# Patient Record
Sex: Female | Born: 1980 | Race: Black or African American | Hispanic: No | Marital: Single | Smoking: Current every day smoker
Health system: Southern US, Community
[De-identification: ages and names within clinical notes are randomized; demographics above are authoritative.]

## PROBLEM LIST (undated history)

## (undated) DIAGNOSIS — F419 Anxiety disorder, unspecified: Secondary | ICD-10-CM

---

## 2011-04-03 ENCOUNTER — Emergency Department: Payer: Self-pay | Admitting: Emergency Medicine

## 2011-06-22 ENCOUNTER — Emergency Department: Payer: Self-pay | Admitting: Emergency Medicine

## 2011-11-26 ENCOUNTER — Emergency Department: Payer: Self-pay | Admitting: Emergency Medicine

## 2012-02-19 ENCOUNTER — Emergency Department: Payer: Self-pay | Admitting: *Deleted

## 2015-06-10 ENCOUNTER — Emergency Department
Admission: EM | Admit: 2015-06-10 | Discharge: 2015-06-10 | Disposition: A | Discharge status: Home / Self Care | Payer: Medicaid - Out of State | Attending: Emergency Medicine | Admitting: Emergency Medicine

## 2015-06-10 ENCOUNTER — Encounter: Payer: Self-pay | Admitting: Emergency Medicine

## 2015-06-10 DIAGNOSIS — F17200 Nicotine dependence, unspecified, uncomplicated: Secondary | ICD-10-CM | POA: Insufficient documentation

## 2015-06-10 DIAGNOSIS — K529 Noninfective gastroenteritis and colitis, unspecified: Secondary | ICD-10-CM

## 2015-06-10 MED ORDER — SODIUM CHLORIDE 0.9 % IV SOLN
Freq: Once | INTRAVENOUS | Status: AC
  Administered 2015-06-10: 12:00:00 via INTRAVENOUS
  Filled 2015-06-10: qty 1000

## 2015-06-10 MED ORDER — ONDANSETRON HCL 4 MG PO TABS: 4.0000 mg | ORAL_TABLET | Freq: Every day | ORAL | Status: AC | PRN

## 2015-06-10 MED ORDER — LOPERAMIDE HCL 2 MG PO CAPS
4.0000 mg | ORAL_CAPSULE | Freq: Once | ORAL | Status: AC
  Administered 2015-06-10: 4 mg via ORAL
  Filled 2015-06-10: qty 2

## 2015-06-10 MED ORDER — ONDANSETRON HCL 4 MG/2ML IJ SOLN
4.0000 mg | Freq: Once | INTRAMUSCULAR | Status: AC
  Administered 2015-06-10: 4 mg via INTRAVENOUS
  Filled 2015-06-10: qty 2

## 2015-06-10 MED ORDER — LOPERAMIDE HCL 2 MG PO TABS: 2.0000 mg | ORAL_TABLET | Freq: Four times a day (QID) | ORAL | Status: AC | PRN

## 2015-06-10 MED ORDER — MORPHINE SULFATE (PF) 4 MG/ML IV SOLN
4.0000 mg | Freq: Once | INTRAVENOUS | Status: AC
  Administered 2015-06-10: 4 mg via INTRAVENOUS
  Filled 2015-06-10: qty 1

## 2015-06-10 NOTE — Discharge Instructions (Signed)
Norovirus Infection °A norovirus infection is caused by exposure to a virus in a group of similar viruses (noroviruses). This type of infection causes inflammation in your stomach and intestines (gastroenteritis). Norovirus is the most common cause of gastroenteritis. It also causes food poisoning. °Anyone can get a norovirus infection. It spreads very easily (contagious). You can get it from contaminated food, water, surfaces, or other people. Norovirus is found in the stool or vomit of infected people. You can spread the infection as soon as you feel sick until 2 weeks after you recover.  °Symptoms usually begin within 2 days after you become infected. Most norovirus symptoms affect the digestive system. °CAUSES °Norovirus infection is caused by contact with norovirus. You can catch norovirus if you: °· Eat or drink something contaminated with norovirus. °· Touch surfaces or objects contaminated with norovirus and then put your hand in your mouth. °· Have direct contact with an infected person who has symptoms. °· Share food, drink, or utensils with someone with who is sick with norovirus. °SIGNS AND SYMPTOMS °Symptoms of norovirus may include: °· Nausea. °· Vomiting. °· Diarrhea. °· Stomach cramps. °· Fever. °· Chills. °· Headache. °· Muscle aches. °· Tiredness. °DIAGNOSIS °Your health care provider may suspect norovirus based on your symptoms and physical exam. Your health care provider may also test a sample of your stool or vomit for the virus.  °TREATMENT °There is no specific treatment for norovirus. Most people get better without treatment in about 2 days. °HOME CARE INSTRUCTIONS °· Replace lost fluids by drinking plenty of water or rehydration fluids containing important minerals called electrolytes. This prevents dehydration. Drink enough fluid to keep your urine clear or pale yellow. °· Do not prepare food for others while you are infected. Wait at least 3 days after recovering from the illness to do  that. °PREVENTION  °· Wash your hands often, especially after using the toilet or changing a diaper. °· Wash fruits and vegetables thoroughly before preparing or serving them. °· Throw out any food that a sick person may have touched. °· Disinfect contaminated surfaces immediately after someone in the household has been sick. Use a bleach-based household cleaner. °· Immediately remove and wash soiled clothes or sheets. °SEEK MEDICAL CARE IF: °· Your vomiting, diarrhea, and stomach pain is getting worse. °· Your symptoms of norovirus do not go away after 2-3 days. °SEEK IMMEDIATE MEDICAL CARE IF:  °You develop symptoms of dehydration that do not improve with fluid replacement. This may include: °· Excessive sleepiness. °· Lack of tears. °· Dry mouth. °· Dizziness when standing. °· Weak pulse. °  °This information is not intended to replace advice given to you by your health care provider. Make sure you discuss any questions you have with your health care provider. °  °Document Released: 09/02/2002 Document Revised: 07/03/2014 Document Reviewed: 11/20/2013 °Elsevier Interactive Patient Education ©2016 [employer] ° °

## 2015-06-10 NOTE — ED Provider Notes (Signed)
[facility] Emergency Department Provider Note     Time seen: ----------------------------------------- 11:27 AM on 06/10/2015 -----------------------------------------    I have reviewed the triage vital signs and the nursing notes.   HISTORY  Chief Complaint Emesis and Diarrhea    HPI Molly Kim is a 34 y.o. female who presents ER with vomiting and diarrhea since Tuesday. She denies any fevers chills or other complaints. Patient states she hasn't been able to keep anything down at least the last 24 hours. She currently works at the [employer]   History reviewed. No pertinent past medical history.  There are no active problems to display for this patient.   History reviewed. No pertinent past surgical history.  Allergies Review of patient's allergies indicates no known allergies.  Social History Social History  Substance Use Topics  . Smoking status: Current Every Day Smoker  . Smokeless tobacco: None  . Alcohol Use: No    Review of Systems Constitutional: Negative for fever. Eyes: Negative for visual changes. ENT: Negative for sore throat. Cardiovascular: Negative for chest pain. Respiratory: Negative for shortness of breath. Gastrointestinal: Positive for abdominal pain, vomiting and diarrhea Genitourinary: Negative for dysuria. Musculoskeletal: Negative for back pain. Skin: Negative for rash. Neurological: Negative for headaches, focal weakness or numbness.  10-point ROS otherwise negative.  ____________________________________________   PHYSICAL EXAM:  VITAL SIGNS: ED Triage Vitals  Enc Vitals Group     BP 06/10/15 1101 92/73 mmHg     Pulse Rate 06/10/15 1101 96     Resp 06/10/15 1101 18     Temp 06/10/15 1101 97.5 F (36.4 C)     Temp Source 06/10/15 1101 Oral     SpO2 06/10/15 1101 95 %     Weight 06/10/15 1101 260 lb (117.935 kg)     Height 06/10/15 1101  (1.702 m)     Head Cir --      Peak Flow --     Pain Score 06/10/15 1106 6     Pain Loc --      Pain Edu? --      Excl. in GC? --     Constitutional: Alert and oriented. Well appearing and in no distress. Eyes: Conjunctivae are normal. PERRL. Normal extraocular movements. ENT   Head: Normocephalic and atraumatic.   Nose: No congestion/rhinnorhea.   Mouth/Throat: Mucous membranes are moist.   Neck: No stridor. Cardiovascular: Normal rate, regular rhythm. Normal and symmetric distal pulses are present in all extremities. No murmurs, rubs, or gallops. Respiratory: Normal respiratory effort without tachypnea nor retractions. Breath sounds are clear and equal bilaterally. No wheezes/rales/rhonchi. Gastrointestinal: Soft and nontender. No distention. No abdominal bruits.  Musculoskeletal: Nontender with normal range of motion in all extremities. No joint effusions.  No lower extremity tenderness nor edema. Neurologic:  Normal speech and language. No gross focal neurologic deficits are appreciated. Speech is normal. No gait instability. Skin:  Skin is warm, dry and intact. No rash noted. Psychiatric: Mood and affect are normal. Speech and behavior are normal. Patient exhibits appropriate insight and judgment. ____________________________________________  ED COURSE:  Pertinent labs & imaging results that were available during my care of the patient were reviewed by me and considered in my medical decision making (see chart for details). Patients in no acute distress, likely nor a virus infection. She'll receive IV fluids and antiemetics. ___________________________________________  FINAL ASSESSMENT AND PLAN  Gastroenteritis  Plan: Patient with labs and imaging as dictated above. Patient is feeling better after IV  fluids and medications. She'll be prescribed antiemetics take as an outpatient.   Emily FilbertWilliams, Jonathan E, MD   Emily FilbertJonathan E Williams, MD 06/10/15 [phone]

## 2015-06-10 NOTE — ED Notes (Signed)
Pt presents with vomiting and diarrhea since Tuesday. Denies fever.

## 2016-01-17 ENCOUNTER — Emergency Department
Admission: EM | Admit: 2016-01-17 | Discharge: 2016-01-17 | Disposition: A | Discharge status: Home / Self Care | Payer: Medicaid Other | Attending: Emergency Medicine | Admitting: Emergency Medicine

## 2016-01-17 ENCOUNTER — Encounter: Payer: Self-pay | Admitting: Emergency Medicine

## 2016-01-17 ENCOUNTER — Emergency Department: Payer: Medicaid Other

## 2016-01-17 DIAGNOSIS — F319 Bipolar disorder, unspecified: Secondary | ICD-10-CM | POA: Diagnosis not present

## 2016-01-17 DIAGNOSIS — F17200 Nicotine dependence, unspecified, uncomplicated: Secondary | ICD-10-CM | POA: Diagnosis not present

## 2016-01-17 DIAGNOSIS — R0789 Other chest pain: Secondary | ICD-10-CM

## 2016-01-17 DIAGNOSIS — F419 Anxiety disorder, unspecified: Secondary | ICD-10-CM | POA: Insufficient documentation

## 2016-01-17 HISTORY — DX: Anxiety disorder, unspecified: F41.9

## 2016-01-17 LAB — TROPONIN I

## 2016-01-17 LAB — COMPREHENSIVE METABOLIC PANEL
ALK PHOS: 68 U/L (ref 38–126)
ALT: 12 U/L — AB (ref 14–54)
AST: 16 U/L (ref 15–41)
Albumin: 3.7 g/dL (ref 3.5–5.0)
Anion gap: 5 (ref 5–15)
BILIRUBIN TOTAL: 0.9 mg/dL (ref 0.3–1.2)
BUN: 10 mg/dL (ref 6–20)
CALCIUM: 8.3 mg/dL — AB (ref 8.9–10.3)
CO2: 27 mmol/L (ref 22–32)
CREATININE: 0.89 mg/dL (ref 0.44–1.00)
Chloride: 109 mmol/L (ref 101–111)
Glucose, Bld: 88 mg/dL (ref 65–99)
Potassium: 3.4 mmol/L — ABNORMAL LOW (ref 3.5–5.1)
Sodium: 141 mmol/L (ref 135–145)
TOTAL PROTEIN: 7.4 g/dL (ref 6.5–8.1)

## 2016-01-17 LAB — CBC
HEMATOCRIT: 36.7 % (ref 35.0–47.0)
HEMOGLOBIN: 12.2 g/dL (ref 12.0–16.0)
MCH: 27 pg (ref 26.0–34.0)
MCHC: 33.3 g/dL (ref 32.0–36.0)
MCV: 80.9 fL (ref 80.0–100.0)
PLATELETS: 320 10*3/uL (ref 150–440)
RBC: 4.54 MIL/uL (ref 3.80–5.20)
RDW: 14.5 % (ref 11.5–14.5)
WBC: 9.1 10*3/uL (ref 3.6–11.0)

## 2016-01-17 MED ORDER — POTASSIUM CHLORIDE CRYS ER 20 MEQ PO TBCR
40.0000 meq | EXTENDED_RELEASE_TABLET | Freq: Once | ORAL | Status: AC
  Administered 2016-01-17: 40 meq via ORAL
  Filled 2016-01-17: qty 2

## 2016-01-17 MED ORDER — IBUPROFEN 800 MG PO TABS
800.0000 mg | ORAL_TABLET | Freq: Once | ORAL | Status: AC
  Administered 2016-01-17: 800 mg via ORAL
  Filled 2016-01-17: qty 1

## 2016-01-17 NOTE — Discharge Instructions (Signed)
Please make an appointment with your primary care physician to have your potassium recheck. Please also talk to your primary care doctor about your anxiety and your chest pain. Appointment with your therapist on Thursday.  Return to the emergency department if you develop severe chest pain, shortness of breath, lightheadedness, palpitations or fainting, or any other symptoms concerning to you.

## 2016-01-17 NOTE — ED Notes (Signed)
Pt. Presenting to ED with symptoms of generalized body aches reporting her limbs feel heavy, and she has a heaviness in her chest and these symptoms are interrupting her ADLs. Pt. States that she has been sleeping more and that is not normal for her.   Pt. States she has a hx of anxiety and she believes this may be the root of her presenting physical symptoms. Pt. Reports many life changes and stressors recently and that her recent move from another state has left her without her anxiety medications for 7 months due to no MD here.

## 2016-01-17 NOTE — ED Notes (Signed)
Pt. Verbalizes understanding of d/c instructions, prescriptions, and follow-up. VS stable.  Pt. In NAD at time of d/c and denies concerns. Pt. Ambulatory Out of the unit with steady gait more calm than initially presenting to tx. Room.

## 2016-01-17 NOTE — ED Provider Notes (Signed)
[facility] Emergency Department Provider Note  ____________________________________________  Time seen: Approximately 8:53 PM  I have reviewed the triage vital signs and the nursing notes.   HISTORY  Chief Complaint Generalized Body Aches; Anxiety; and Chest Pain    HPI Molly Kim is a 35 y.o. female with a history of anxiety and bipolar disorder but otherwise negative for any other cardiac risk factors presenting with chest pain and panic attacks. The patient reports that for the past week she has had occasionally intermittent and occasionally constant chest pressure across the entirety of her chest pain she has no associated shortness of breath, palpitations, lightheadedness or syncope. She has had no lower extremity swelling or calf pain. This feels similar to her previous anxiety, but she has had increased stress at home and "wanted to get checked out to make sure I was okay." The patient has a weakly appointment with her therapist on Thursdays, and this has been helping her anxiety and stress.  FH: No history of early heart disease SH: Denies tobacco, cocaine.  Past Medical History:  Diagnosis Date  . Anxiety     There are no active problems to display for this patient.   History reviewed. No pertinent surgical history.  Current Outpatient Rx  . Order #: [id number] Class: Print  . Order #: [id number] Class: Print    Allergies Depakote [valproic acid]  History reviewed. No pertinent family history.  Social History Social History  Substance Use Topics  . Smoking status: Current Every Day Smoker  . Smokeless tobacco: Never Used  . Alcohol use No    Review of Systems Constitutional: No fever/chills.No lightheadedness or syncope Eyes: No visual changes. ENT: No sore throat. No congestion or rhinorrhea. Cardiovascular: Positive chest pain. Denies palpitations. Respiratory: Denies shortness of breath.  No cough. Gastrointestinal: No  abdominal pain.  No nausea, no vomiting.  No diarrhea.  No constipation. Genitourinary: Negative for dysuria. Musculoskeletal: Negative for back pain. Skin: Negative for rash. Neurological: Negative for headaches. No focal numbness, tingling or weakness.  Psychiatric:Positive for anxiety. No SI, HI or hallucinations.  10-point ROS otherwise negative.  ____________________________________________   PHYSICAL EXAM:  VITAL SIGNS: ED Triage Vitals  Enc Vitals Group     BP 01/17/16 1936 (!) 125/94     Pulse Rate 01/17/16 1936 78     Resp 01/17/16 1936 18     Temp 01/17/16 1936 98.2 F (36.8 C)     Temp Source 01/17/16 1936 Oral     SpO2 01/17/16 1936 98 %     Weight 01/17/16 1936 262 lb (118.8 kg)     Height 01/17/16 1936  (1.702 m)     Head Circumference --      Peak Flow --      Pain Score 01/17/16 1938 8     Pain Loc --      Pain Edu? --      Excl. in GC? --     Constitutional: Alert and oriented. Well appearing and in no acute distress. Answers questions appropriately. Eyes: Conjunctivae are normal.  EOMI. No scleral icterus. Head: Atraumatic. Nose: No congestion/rhinnorhea. Mouth/Throat: Mucous membranes are moist.  Neck: No stridor.  Supple.  No JVD Cardiovascular: Normal rate, regular rhythm. No murmurs, rubs or gallops.  Respiratory: Normal respiratory effort.  No accessory muscle use or retractions. Lungs CTAB.  No wheezes, rales or ronchi. Gastrointestinal: Soft, nontender and nondistended.  No guarding or rebound.  No peritoneal signs. Musculoskeletal: No LE edema. No ttp  in the calves or palpable cords.  Negative Homan's sign. Neurologic:  A&Ox3.  Speech is clear.  Face and smile are symmetric.  EOMI.  Moves all extremities well. Skin:  Skin is warm, dry and intact. No rash noted. Psychiatric: Mood and affect are normal. Speech and behavior are normal.  Normal judgement. No evidence of significant anxiety at this time. The patient again denies any SI, HI or  hallucinations  ____________________________________________   LABS (all labs ordered are listed, but only abnormal results are displayed)  Labs Reviewed  COMPREHENSIVE METABOLIC PANEL - Abnormal; Notable for the following:       Result Value   Potassium 3.4 (*)    Calcium 8.3 (*)    ALT 12 (*)    All other components within normal limits  CBC  TROPONIN I   ____________________________________________  EKG  ED ECG REPORT I, Rockne Menghini, the attending physician, personally viewed and interpreted this ECG.   Date: 01/17/2016  EKG Time: 2011  Rate: 72  Rhythm: normal sinus rhythm  Axis: normal  Intervals:first-degree A-V block   ST&T Change: No ST elevation. No evidence of prolonged QTC. No evidence of Brugada. No evidence of hypertrophy.  ____________________________________________  RADIOLOGY  Dg Chest 2 View  Result Date: 01/17/2016 CLINICAL DATA:  Chest pain for 2 weeks. EXAM: CHEST  2 VIEW COMPARISON:  None. FINDINGS: The cardiac silhouette, mediastinal and hilar contours are within normal limits. The lungs are clear. No pleural effusion. The bony thorax is intact. IMPRESSION: No acute cardiopulmonary findings. Electronically Signed   By: Rudie Meyer M.D.   On: 01/17/2016 20:05   ____________________________________________   PROCEDURES  Procedure(s) performed: None  Procedures  Critical Care performed: No ____________________________________________   INITIAL IMPRESSION / ASSESSMENT AND PLAN / ED COURSE  Pertinent labs & imaging results that were available during my care of the patient were reviewed by me and considered in my medical decision making (see chart for details).  35 y.o. female with a history of anxiety, without any history of hypertension, hyperlipidemia, diabetes, early CAD family history, or cocaine use or tobacco abuse, presenting with 1 week of chest pressure. Overall, the patient is well-appearing and has a normal  cardiopulmonary examination. She has no evidence of DVT. Her EKG does not show any ischemic changes or arrhythmias, her troponin is negative and her chest x-ray does not show any acute cardiopulmonary disease. At this time, the patient is asymptomatic. It is likely that her symptoms are due to increasing stress that is triggering her anxiety, and a cardiac or pulmonary cause for her symptoms is much less likely. ACS or MI or PE are very unlikely. I will plan to discharge the patient home, supplement her potassium, and she will follow up with her primary care physician. In addition she will keep her therapy appointment on Thursday. Return precautions as well as follow-up instructions were discussed.  ____________________________________________  FINAL CLINICAL IMPRESSION(S) / ED DIAGNOSES  Final diagnoses:  Anxiety  Atypical chest pain    Clinical Course      NEW MEDICATIONS STARTED DURING THIS VISIT:  New Prescriptions   No medications on file      Rockne Menghini, MD 01/17/16 2057

## 2016-09-30 IMAGING — CR DG CHEST 2V
1 series · 2 of 2 positions shown · non-contrast
Comparison: None.

CLINICAL DATA: Chest pain for 2 weeks.

EXAM:
CHEST  2 VIEW

[Series 1: dg chest 2 view · 0.14mm/px · 2 of 2 slices shown]
[im 1/2]
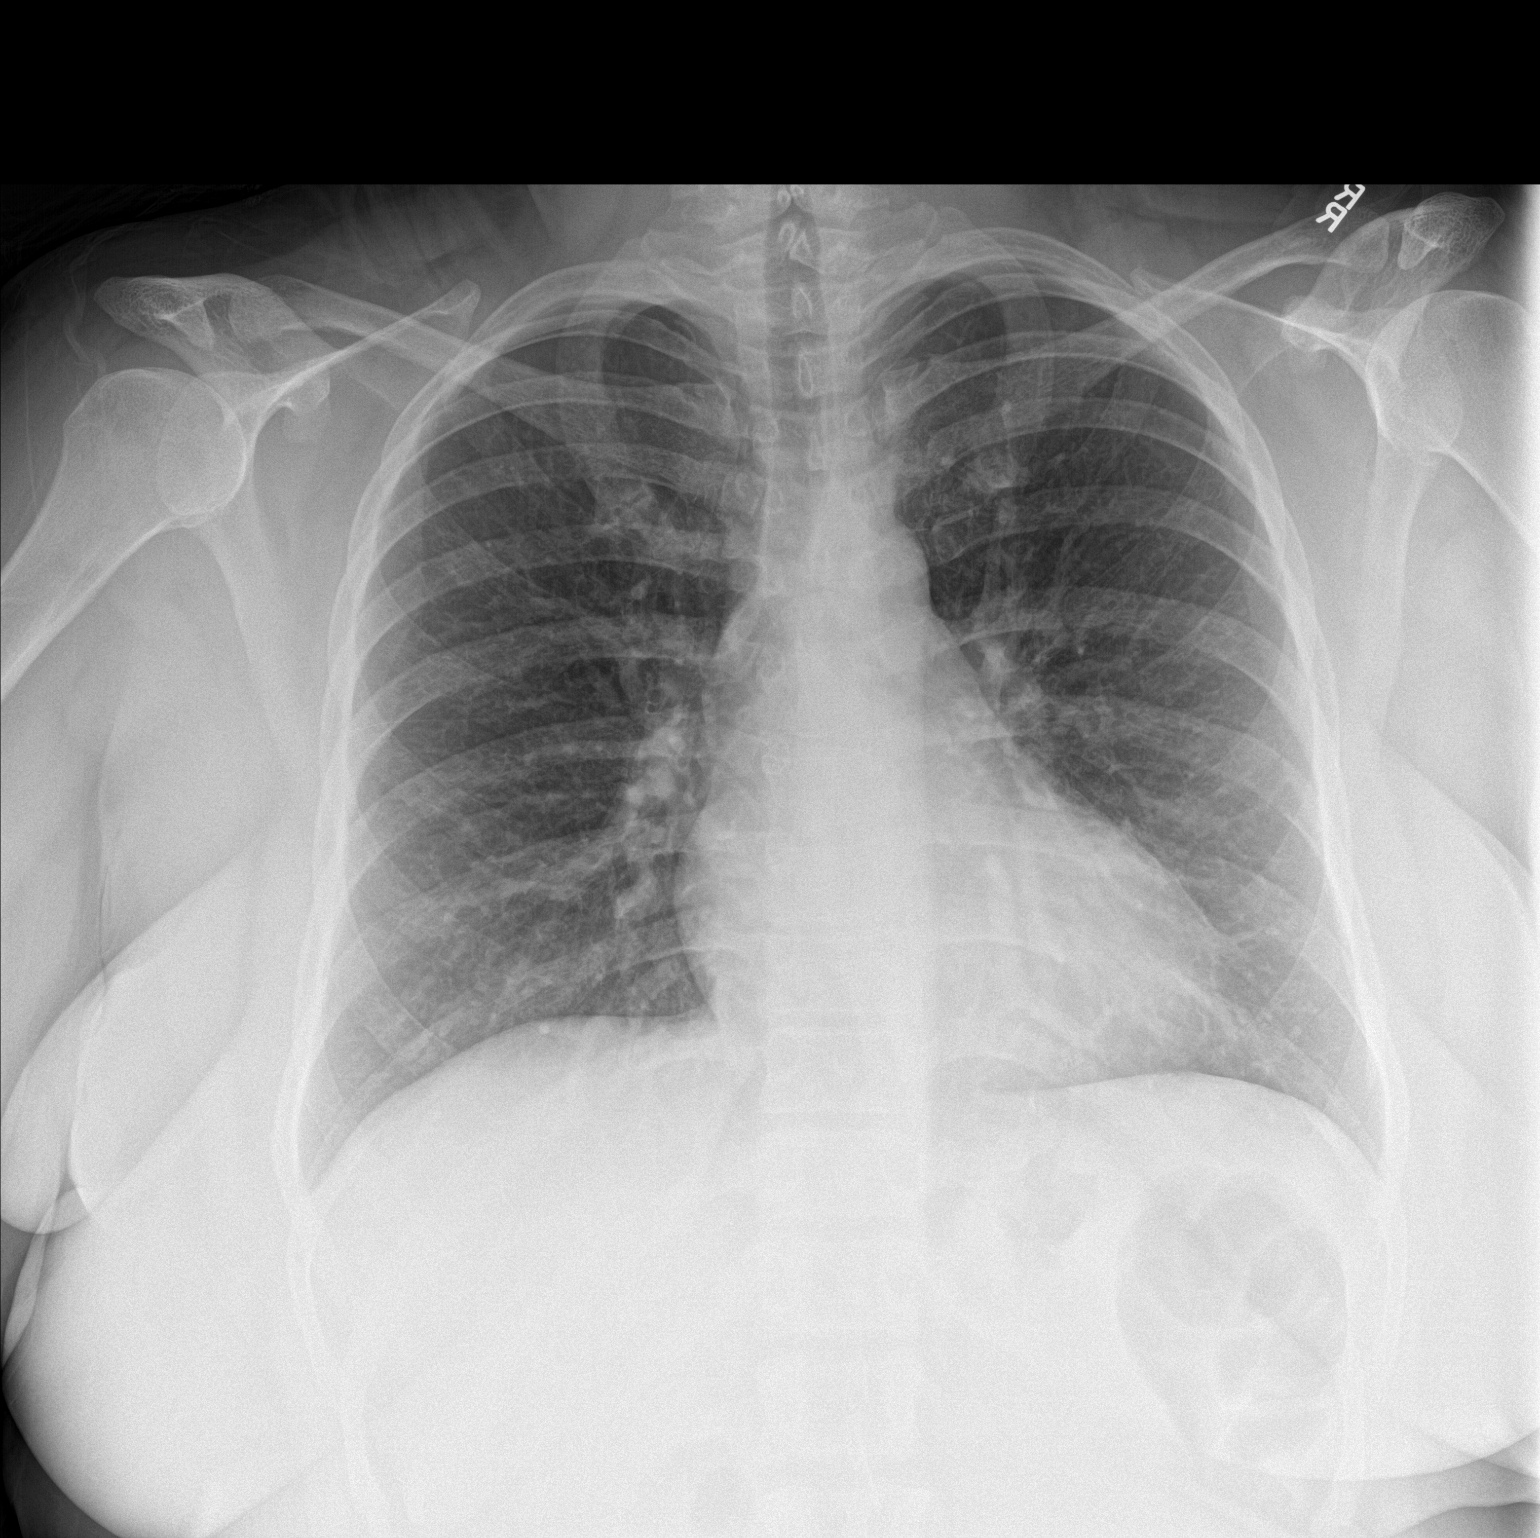
[im 2/2]
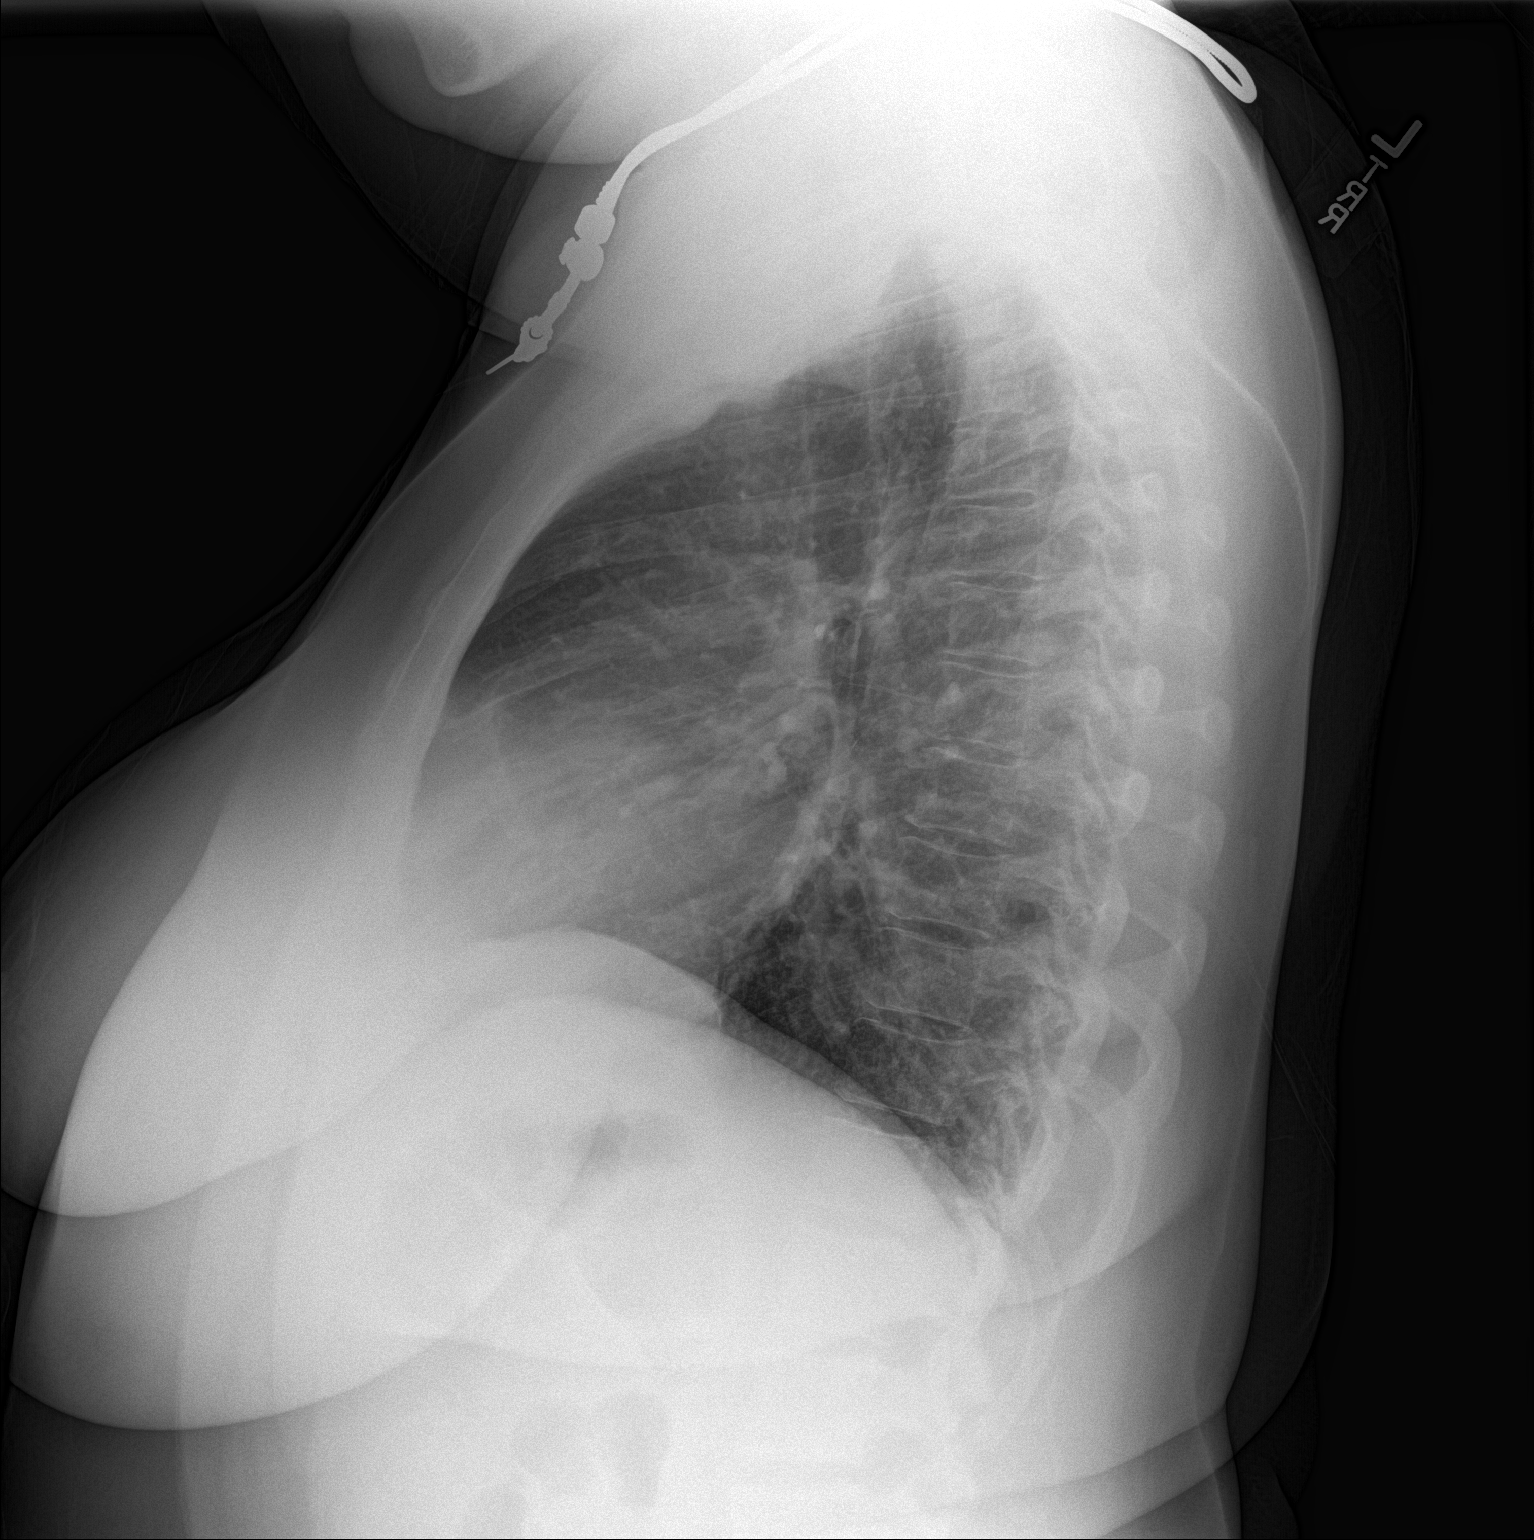

[2 of 2 positions shown; findings below may reference images not displayed]

FINDINGS: The cardiac silhouette, mediastinal and hilar contours are within
normal limits. The lungs are clear. No pleural effusion. The bony
thorax is intact.
IMPRESSION: No acute cardiopulmonary findings.
